# Patient Record
Sex: Female | Born: 1985 | Race: Black or African American | Hispanic: No | Marital: Single | Smoking: Never smoker
Health system: Southern US, Community
[De-identification: ages and names within clinical notes are randomized; demographics above are authoritative.]

## PROBLEM LIST (undated history)

## (undated) ENCOUNTER — Inpatient Hospital Stay (HOSPITAL_COMMUNITY): Payer: Self-pay

## (undated) DIAGNOSIS — Z8619 Personal history of other infectious and parasitic diseases: Secondary | ICD-10-CM

## (undated) DIAGNOSIS — D649 Anemia, unspecified: Secondary | ICD-10-CM

## (undated) HISTORY — DX: Anemia, unspecified: D64.9

## (undated) HISTORY — DX: Personal history of other infectious and parasitic diseases: Z86.19

---

## 2013-01-24 LAB — OB RESULTS CONSOLE GC/CHLAMYDIA
Chlamydia: NEGATIVE
Gonorrhea: NEGATIVE

## 2013-01-24 LAB — OB RESULTS CONSOLE HIV ANTIBODY (ROUTINE TESTING): HIV: NONREACTIVE

## 2013-01-24 LAB — OB RESULTS CONSOLE ABO/RH: RH TYPE: POSITIVE

## 2013-01-24 LAB — OB RESULTS CONSOLE RUBELLA ANTIBODY, IGM: Rubella: IMMUNE

## 2013-01-24 LAB — OB RESULTS CONSOLE RPR: RPR: NONREACTIVE

## 2013-01-24 LAB — OB RESULTS CONSOLE ANTIBODY SCREEN: Antibody Screen: NEGATIVE

## 2013-01-24 LAB — OB RESULTS CONSOLE HEPATITIS B SURFACE ANTIGEN: HEP B S AG: NEGATIVE

## 2013-02-06 NOTE — L&D Delivery Note (Addendum)
Called by RN @ approx 2:00 AM to evaluate pt due to urge to push. I entered the room to find pt calm in tub stating she could feel the "baby coming." On eval, head noted to be on perineum and BOWI. Cat 1 tracing obtained prior to pt getting in tub.  Delivery Note At 2:28 AM a viable female "Djet" was delivered encaul via Vaginal, Spontaneous Delivery (Presentation: Left Occiput Anterior) by Linden DolinWaterbirth.  APGAR: 9, 9; weight 8 lb 4.4 oz (3754 g).   Placenta status: Intact, Spontaneous.  Cord: 3 vessels with the following complications: None.  Cord pH: NA  Anesthesia: Local for repair Episiotomy: None Lacerations: 2nd degree vaginal and bilateral periurethrals Suture Repair: 3.0 vicryl vicryl rapide Est. Blood Loss (mL): 400 Pitocin IM given after delivery of placenta  Mom to postpartum.  Baby to Couplet care / Skin to Skin.  Parents decline circumcision  Mom plans to breastfeed   Sherre ScarletWILLIAMS, Paizleigh Wilds 09/13/2013, 4:39 AM

## 2013-04-09 ENCOUNTER — Encounter: Payer: Self-pay | Admitting: Obstetrics & Gynecology

## 2013-04-10 ENCOUNTER — Encounter: Payer: Self-pay | Admitting: Obstetrics & Gynecology

## 2013-04-20 ENCOUNTER — Inpatient Hospital Stay (HOSPITAL_COMMUNITY): Admission: AD | Admit: 2013-04-20 | Payer: Self-pay | Source: Ambulatory Visit | Admitting: Obstetrics & Gynecology

## 2013-08-21 ENCOUNTER — Telehealth (HOSPITAL_COMMUNITY): Payer: Self-pay | Admitting: *Deleted

## 2013-08-21 ENCOUNTER — Encounter (HOSPITAL_COMMUNITY): Payer: Self-pay | Admitting: *Deleted

## 2013-08-21 NOTE — Telephone Encounter (Signed)
Preadmission screen  

## 2013-08-22 ENCOUNTER — Observation Stay (HOSPITAL_COMMUNITY)
Admission: RE | Admit: 2013-08-22 | Discharge: 2013-08-22 | Disposition: A | Discharge status: Home / Self Care | Payer: Managed Care, Other (non HMO) | Source: Ambulatory Visit | Attending: Obstetrics and Gynecology | Admitting: Obstetrics and Gynecology

## 2013-08-22 VITALS — BP 115/60 | HR 72 | Temp 98.0°F | Resp 16

## 2013-08-22 DIAGNOSIS — O321XX0 Maternal care for breech presentation, not applicable or unspecified: Secondary | ICD-10-CM | POA: Diagnosis present

## 2013-08-22 DIAGNOSIS — O321XX1 Maternal care for breech presentation, fetus 1: Secondary | ICD-10-CM

## 2013-08-22 DIAGNOSIS — Z538 Procedure and treatment not carried out for other reasons: Secondary | ICD-10-CM | POA: Diagnosis not present

## 2013-08-22 DIAGNOSIS — O322XX0 Maternal care for transverse and oblique lie, not applicable or unspecified: Secondary | ICD-10-CM | POA: Diagnosis present

## 2013-08-22 LAB — RPR

## 2013-08-22 LAB — CBC
HCT: 35.4 % — ABNORMAL LOW (ref 36.0–46.0)
Hemoglobin: 11.6 g/dL — ABNORMAL LOW (ref 12.0–15.0)
MCH: 27.9 pg (ref 26.0–34.0)
MCHC: 32.8 g/dL (ref 30.0–36.0)
MCV: 85.1 fL (ref 78.0–100.0)
PLATELETS: 207 10*3/uL (ref 150–400)
RBC: 4.16 MIL/uL (ref 3.87–5.11)
RDW: 14.6 % (ref 11.5–15.5)
WBC: 7.3 10*3/uL (ref 4.0–10.5)

## 2013-08-22 LAB — TYPE AND SCREEN
ABO/RH(D): O POS
ANTIBODY SCREEN: NEGATIVE

## 2013-08-22 LAB — ABO/RH: ABO/RH(D): O POS

## 2013-08-22 MED ORDER — TERBUTALINE SULFATE 1 MG/ML IJ SOLN: 0.2500 mg | Freq: Once | INTRAMUSCULAR | Status: DC

## 2013-08-22 MED ORDER — ACETAMINOPHEN 325 MG PO TABS: 650.0000 mg | ORAL_TABLET | ORAL | Status: DC | PRN

## 2013-08-22 MED ORDER — ONDANSETRON HCL 4 MG/2ML IJ SOLN: 4.0000 mg | Freq: Four times a day (QID) | INTRAMUSCULAR | Status: DC | PRN

## 2013-08-22 MED ORDER — IBUPROFEN 600 MG PO TABS: 600.0000 mg | ORAL_TABLET | Freq: Four times a day (QID) | ORAL | Status: DC | PRN

## 2013-08-22 MED ORDER — LACTATED RINGERS IV SOLN: 500.0000 mL | INTRAVENOUS | Status: DC | PRN

## 2013-08-22 MED ORDER — LACTATED RINGERS IV SOLN
INTRAVENOUS | Status: DC
  Administered 2013-08-22: 08:00:00 via INTRAVENOUS

## 2013-08-22 MED ORDER — OXYCODONE-ACETAMINOPHEN 5-325 MG PO TABS: 1.0000 | ORAL_TABLET | ORAL | Status: DC | PRN

## 2013-08-22 NOTE — Progress Notes (Signed)
External Cephalic Version Note  Indication: SIUP at 37+6  weeks with  Transverse presentation   Procedure has been reviewed with patient with R&B including need to proceed emergently with cesarean delivery fetal intolerance to the procedure. Patient is agreeable to proceed and has signed the consent form.  Patient is NPO with a saline lock.  NST pre-procedure: Category 1 with baseline at 140 bpm  Bedside ultrasound: cephalic position with normal AFI  Procedure:  Cancelled   NST post-procedure: N/A  Patient was discharged in good condition [facility] as scheduled   Silverio LaySandra Mattilyn Crites MD

## 2013-08-22 NOTE — Progress Notes (Signed)
Reviewed D/C instructions with pt and FOB.  Answered questions for family.  Copy of instructions given to pt and FOB.  Keep scheduled appt with office next week.

## 2013-08-22 NOTE — Discharge Summary (Signed)
[facility] external version.  After US For position it was determined that the fetus was in the vertex position and per Dr Estanislado Pandyivard the external version was canceled and further fetal monitor was not necessary.  Pt DC'd to home with labor precaution and kick count instructions.  Pt encourage to keep her next ROB appointment.

## 2013-09-12 ENCOUNTER — Inpatient Hospital Stay (HOSPITAL_COMMUNITY)
Admission: AD | Admit: 2013-09-12 | Discharge: 2013-09-12 | Disposition: A | Discharge status: Home / Self Care | Payer: Managed Care, Other (non HMO) | Source: Ambulatory Visit | Attending: Obstetrics & Gynecology | Admitting: Obstetrics & Gynecology

## 2013-09-12 ENCOUNTER — Encounter (HOSPITAL_COMMUNITY): Payer: Self-pay

## 2013-09-12 ENCOUNTER — Inpatient Hospital Stay (HOSPITAL_COMMUNITY)
Admission: AD | Admit: 2013-09-12 | Discharge: 2013-09-15 | DRG: 775 | Disposition: A | Discharge status: Home / Self Care | Payer: Managed Care, Other (non HMO) | Source: Ambulatory Visit | Attending: Obstetrics and Gynecology | Admitting: Obstetrics and Gynecology

## 2013-09-12 DIAGNOSIS — F1210 Cannabis abuse, uncomplicated: Secondary | ICD-10-CM | POA: Diagnosis present

## 2013-09-12 DIAGNOSIS — Z91040 Latex allergy status: Secondary | ICD-10-CM

## 2013-09-12 DIAGNOSIS — O99344 Other mental disorders complicating childbirth: Secondary | ICD-10-CM | POA: Diagnosis present

## 2013-09-12 DIAGNOSIS — Z8249 Family history of ischemic heart disease and other diseases of the circulatory system: Secondary | ICD-10-CM

## 2013-09-12 DIAGNOSIS — D509 Iron deficiency anemia, unspecified: Secondary | ICD-10-CM | POA: Diagnosis present

## 2013-09-12 DIAGNOSIS — O479 False labor, unspecified: Secondary | ICD-10-CM | POA: Insufficient documentation

## 2013-09-12 DIAGNOSIS — O9902 Anemia complicating childbirth: Secondary | ICD-10-CM | POA: Diagnosis present

## 2013-09-12 MED ORDER — OXYCODONE-ACETAMINOPHEN 5-325 MG PO TABS: 1.0000 | ORAL_TABLET | ORAL | Status: DC | PRN

## 2013-09-12 MED ORDER — FLEET ENEMA 7-19 GM/118ML RE ENEM: 1.0000 | ENEMA | RECTAL | Status: DC | PRN

## 2013-09-12 MED ORDER — OXYTOCIN BOLUS FROM INFUSION: 500.0000 mL | INTRAVENOUS | Status: DC

## 2013-09-12 MED ORDER — LACTATED RINGERS IV SOLN: 500.0000 mL | INTRAVENOUS | Status: DC | PRN

## 2013-09-12 MED ORDER — ACETAMINOPHEN 325 MG PO TABS: 650.0000 mg | ORAL_TABLET | ORAL | Status: DC | PRN

## 2013-09-12 MED ORDER — IBUPROFEN 600 MG PO TABS: 600.0000 mg | ORAL_TABLET | Freq: Four times a day (QID) | ORAL | Status: DC | PRN

## 2013-09-12 MED ORDER — CITRIC ACID-SODIUM CITRATE 334-500 MG/5ML PO SOLN: 30.0000 mL | ORAL | Status: DC | PRN

## 2013-09-12 MED ORDER — LIDOCAINE HCL (PF) 1 % IJ SOLN
30.0000 mL | INTRAMUSCULAR | Status: DC | PRN
  Administered 2013-09-13: 30 mL via SUBCUTANEOUS
  Filled 2013-09-12: qty 30

## 2013-09-12 MED ORDER — OXYTOCIN 40 UNITS IN LACTATED RINGERS INFUSION - SIMPLE MED: 62.5000 mL/h | INTRAVENOUS | Status: DC

## 2013-09-12 NOTE — H&P (Signed)
[name] is a 28 y.o. female, G1P0 at 41.0 weeks, presenting for possible LOF and ctxs all day (q 7-10 min), that have become progressively worse, now q 5 min since arrival to hospital. Reports bloody show and active fetus. Attended WB class and planning WB. Last evaluation at [redacted]w[redacted]d - NST reactive.  Patient Active Problem List   Diagnosis Date Noted  . Normal labor 09/12/2013  . Latex allergy - Pruritis 09/12/2013  . Anemia, iron deficiency 09/12/2013  Transfer in from [facility] Refused 1hr glucola after appropriate counseling Refused GBS screen after apropriate counseling  History of present pregnancy: Patient entered care at 27.6 weeks, transfer in from [facility].   EDC of 09/05/13 was established by LMP.   Anatomy scan: 19.4 weeks, with normal findings and a posterior placenta.   Additional [place] evaluations:  [redacted]w[redacted]d due to transfer of care - SIUP, vtx presentation, posterior placenta; no previa, normal fluid, normal placenta cord insertion; AFI 15.5 cm (55th%tile); no late presenting fetal abnormality seen; cervix closed. [redacted]w[redacted]d for presentation - SIUP, transverse presentation, AFI normal (50th percentile). [redacted]w[redacted]d for presentation/late term: Vtx presentation, AFI normal (65th percentile), BPP 8/8 in 3 min.    Significant prenatal events: Was scheduled for ECV on 08/22/13 @ 37.6 wks 2/2 transverse presentation, but noted to be vertex on day of procedure. Struggled with headaches in pregnancy - no significant findings.    Last evaluation:  @ [redacted]w[redacted]d; cervix FT/thick/-3 .  OB History   Grav Para Term Preterm Abortions TAB SAB Ect Mult Living   1              Past Medical History  Diagnosis Date  . Anemia   . Hx of chlamydia infection   . Hx of gonorrhea    History reviewed. No pertinent past surgical history. Family History: family history includes Heart disease in her maternal grandmother; Miscarriages / [place] in her maternal aunt. FOB with IDDM; FOB's sister w/  Grave's disease. Social History:  reports that she has never smoked. She does not have any smokeless tobacco history on file. She reports that she uses illicit drugs (Marijuana). Her alcohol history is not on file. FOB Sheran Lawless Agbaza present and supportive. Pt is African-American with a 4-yr college degree; pt is a vegetarian.   Prenatal Transfer Tool  Maternal Diabetes: Unknown; refused 1hr; CBG log never brought to prenatal visits for review Genetic Screening: Declined Maternal Ultrasounds/Referrals: Normal Fetal Ultrasounds or other Referrals:  None Maternal Substance Abuse:  Yes:  Type: Marijuana x 1 since LMP Significant Maternal Medications:  Meds include: Other: Iron daily, PNV, Primose oil Significant Maternal Lab Results: Lab values include: Other: GBS unknown. Pt. Refused screening before and after thorough counseling    ROS:  Ctxs, VB, LOF, +FM  Allergies  Allergen Reactions  . Latex Itching     Dilation: 4 Effacement (%): 50 Station: -2 Exam by:: Dia Sitter RN  Blood pressure 126/75, pulse 92, temperature 98.4 F (36.9 C), temperature source Oral, resp. rate 16, height 5\' 6"  (1.676 m), weight 203 lb (92.08 kg), last menstrual period 11/30/2012, SpO2 91.00%.  Gen: NAD, breathing through ctxs Chest clear Heart RRR without murmur Abd gravid, palpate soft between ctxs, NT, FH 39 cm on 09/09/13 Pelvic: 4/50/-2 Cephalic by Leopolds, confirmed by bedside sono Ext: WNL  FHR: Cat 1, BPP 8/8 on 09/09/13  UCs: Irregular  Prenatal labs: ABO, Rh: --/--/O POS, O POS (07/17 0740) Antibody: NEG (07/17 0740) Rubella:   Immune RPR:  NON REAC (07/17 0740)  HBsAg: Negative (12/19 0000)  HIV: Non-reactive (12/19 0000)  GBS: Unknown Sickle cell/Hgb electrophoresis: Normal study Pap: Normal in Feb 2014 GC: Neg Chlamydia:  Neg Genetic screenings:  Declined Glucola:  Refused. Was to monitor CBGs qid x 2 wks, but never brought log to office visits; states values were all  normal. Other: Hgb 9.6 in 01/2013; indices done; transferrin elevated. Took Iron throughout pregnancy. Urine culture negative.  Assessment: IUP at 41.0 wks Late term pregnancy Latent phase labor GBS unknown   Plan: Admitted to BS Routine [facility] orders Birth plan reviewed Intermittent monitoring; planning waterbirth Consult prn Expect progress and SVD  Robyne AskewWILLIAMS, KIMBERLYCNM, MS 09/12/13 @ 11:09 PM

## 2013-09-12 NOTE — MAU Provider Note (Signed)
History    Patient is a 27y.o. G1P0 at 40.6wks who presents for contractions.  Patient states she has been contracting all day and recently contractions have become more frequent.  Patient also reporting active fetus and bloody show, but denies LoF.  Patient desires WB and GBS status is unknown due to patient refusal.     Patient Active Problem List   Diagnosis Date Noted  . Breech presentation 08/22/2013    Chief Complaint  Patient presents with  . Labor Eval   HPI  OB History   Grav Para Term Preterm Abortions TAB SAB Ect Mult Living   1               Past Medical History  Diagnosis Date  . Anemia   . Hx of chlamydia infection   . Hx of gonorrhea     History reviewed. No pertinent past surgical history.  Family History  Problem Relation Age of Onset  . Miscarriages / Stillbirths Maternal Aunt   . Heart disease Maternal Grandmother     History  Substance Use Topics  . Smoking status: Never Smoker   . Smokeless tobacco: Not on file  . Alcohol Use: Not on file    Allergies:  Allergies  Allergen Reactions  . Latex Itching    Prescriptions prior to admission  Medication Sig Dispense Refill  . Prenatal Vit-Fe Fumarate-FA (MULTIVITAMIN-PRENATAL) 27-0.8 MG TABS tablet Take 1 tablet by mouth daily at 12 noon.        ROS  See HPI Above Physical Exam   Blood pressure 130/76, pulse 73, temperature 98.3 F (36.8 C), temperature source Oral, resp. rate 18, height 5\' 6"  (1.676 m), weight 207 lb (93.895 kg), last menstrual period 11/30/2012.  Physical Exam  Constitutional: She is oriented to person, place, and time. She appears well-developed and well-nourished. No distress.  Bright affect, smiling and talking through contractions.  Cardiovascular: Normal rate.   Respiratory: Effort normal.  GI: Soft.  Appears gravid--fundal height appropriate for GA Contractions palpate mild to moderate, fundus soft and NT during rest.   Genitourinary: Vagina normal.  -SVE:  0.5/50/-3, Soft, Posterior, Bloody Show  Neurological: She is alert and oriented to person, place, and time.  Skin: Skin is warm and dry.   FHR: 125 bpm, Mod Var, -Decels, +Accels UC: Q5-737min, graphs strong, palpates moderate ED Course  Assessment: IUP at 40.6wks Early Labor Cat I FT  Plan: -Patient given option to be observed for 1-2hrs for cervical change or return home to labor at home. -Patient opt to return home and [place] -Discussed providers discomfort with patient being located >3530min from hospital.  Patient verbalized understanding, but desires comfort of home. -Precautions discussed including Bleeding, SROM, Fetal Movement, Transport while Contracting, and [facility]als [facility] -Patient instructed to call every 2hrs to update provider and report to hospital immediately as discussed in precautions--Patient and husband verbalized understanding -NST reactive -Patient discharged to home  [facility] HospitalEMLY, Odilia Damico LYNN CNM, MSN 09/12/2013 3:39 AM

## 2013-09-12 NOTE — MAU Note (Signed)
Water broke about 1915.  Some bloody streaks in fld but otherwise clear. Was here this am for labor ck. And closed. Desires water birth

## 2013-09-12 NOTE — MAU Note (Signed)
Pt c/o contractions every 5-10 mins. Denies LOF or vag bleeding. +FM

## 2013-09-12 NOTE — Progress Notes (Signed)
Patient declined CBC

## 2013-09-12 NOTE — Progress Notes (Signed)
[facility] to [facility] via w/c for further eval of SROM

## 2013-09-12 NOTE — Discharge Instructions (Signed)

## 2013-09-13 ENCOUNTER — Encounter (HOSPITAL_COMMUNITY): Payer: Self-pay

## 2013-09-13 LAB — CBC
HCT: 33.1 % — ABNORMAL LOW (ref 36.0–46.0)
Hemoglobin: 11.2 g/dL — ABNORMAL LOW (ref 12.0–15.0)
MCH: 28.1 pg (ref 26.0–34.0)
MCHC: 33.8 g/dL (ref 30.0–36.0)
MCV: 83.2 fL (ref 78.0–100.0)
Platelets: 207 10*3/uL (ref 150–400)
RBC: 3.98 MIL/uL (ref 3.87–5.11)
RDW: 14.7 % (ref 11.5–15.5)
WBC: 22 10*3/uL — ABNORMAL HIGH (ref 4.0–10.5)

## 2013-09-13 MED ORDER — SENNOSIDES-DOCUSATE SODIUM 8.6-50 MG PO TABS: 2.0000 | ORAL_TABLET | ORAL | Status: DC

## 2013-09-13 MED ORDER — BENZOCAINE-MENTHOL 20-0.5 % EX AERO: 1.0000 "application " | INHALATION_SPRAY | CUTANEOUS | Status: DC | PRN

## 2013-09-13 MED ORDER — IBUPROFEN 600 MG PO TABS
600.0000 mg | ORAL_TABLET | Freq: Four times a day (QID) | ORAL | Status: DC
  Filled 2013-09-13: qty 1

## 2013-09-13 MED ORDER — DIPHENHYDRAMINE HCL 25 MG PO CAPS: 25.0000 mg | ORAL_CAPSULE | Freq: Four times a day (QID) | ORAL | Status: DC | PRN

## 2013-09-13 MED ORDER — ONDANSETRON HCL 4 MG PO TABS: 4.0000 mg | ORAL_TABLET | ORAL | Status: DC | PRN

## 2013-09-13 MED ORDER — DIBUCAINE 1 % RE OINT: 1.0000 "application " | TOPICAL_OINTMENT | RECTAL | Status: DC | PRN

## 2013-09-13 MED ORDER — OXYTOCIN 10 UNIT/ML IJ SOLN
INTRAMUSCULAR | Status: AC
  Administered 2013-09-13: 10 [IU]
  Filled 2013-09-13: qty 1

## 2013-09-13 MED ORDER — WITCH HAZEL-GLYCERIN EX PADS: 1.0000 "application " | MEDICATED_PAD | CUTANEOUS | Status: DC | PRN

## 2013-09-13 MED ORDER — ONDANSETRON HCL 4 MG/2ML IJ SOLN: 4.0000 mg | INTRAMUSCULAR | Status: DC | PRN

## 2013-09-13 MED ORDER — SIMETHICONE 80 MG PO CHEW: 80.0000 mg | CHEWABLE_TABLET | ORAL | Status: DC | PRN

## 2013-09-13 MED ORDER — MEDROXYPROGESTERONE ACETATE 150 MG/ML IM SUSP: 150.0000 mg | INTRAMUSCULAR | Status: DC | PRN

## 2013-09-13 MED ORDER — PRENATAL MULTIVITAMIN CH: 1.0000 | ORAL_TABLET | Freq: Every day | ORAL | Status: DC

## 2013-09-13 MED ORDER — OXYCODONE-ACETAMINOPHEN 5-325 MG PO TABS: 1.0000 | ORAL_TABLET | ORAL | Status: DC | PRN

## 2013-09-13 MED ORDER — ZOLPIDEM TARTRATE 5 MG PO TABS: 5.0000 mg | ORAL_TABLET | Freq: Every evening | ORAL | Status: DC | PRN

## 2013-09-13 MED ORDER — LANOLIN HYDROUS EX OINT: TOPICAL_OINTMENT | CUTANEOUS | Status: DC | PRN

## 2013-09-13 MED ORDER — TETANUS-DIPHTH-ACELL PERTUSSIS 5-2.5-18.5 LF-MCG/0.5 IM SUSP: 0.5000 mL | Freq: Once | INTRAMUSCULAR | Status: DC

## 2013-09-13 NOTE — Progress Notes (Signed)
Clinical Social Work Department PSYCHOSOCIAL ASSESSMENT - MATERNAL/CHILD 09/13/2013  Patient:  [name]  Account Number:  [id number]  Admit Date:  09/12/2013  Childs Name:   [name]    Clinical Social Worker:  Keli Buehner, LCSW   Date/Time:  09/13/2013 01:12 AM  Date Referred:  09/13/2013   Referral source  Central Nursery     Referred reason  Substance Abuse   Other referral source:    I:  FAMILY / HOME ENVIRONMENT Child's legal guardian:  PARENT  Guardian - Name Guardian - Age Guardian - Address  Jackie [name] 27 [address]  [place], [place] [address]  [name], [name]  same as above   Other household support members/support persons Other support:   Family    II  PSYCHOSOCIAL DATA Information Source:    Financial and Community Resources Employment:   Financial resources:  Private Insurance If Medicaid - County:   Other  WIC   School / Grade:   Maternity Care Coordinator / Child Services Coordination / Early Interventions:  Cultural issues impacting care:    III  STRENGTHS Strengths  Supportive family/friends  Home prepared for Child (including basic supplies)  Adequate Resources   Strength comment:    IV  RISK FACTORS AND CURRENT PROBLEMS Current Problem:       V  SOCIAL WORK ASSESSMENT Acknowledged order for Social Work consult to assess mother's history of marijuana.  FOB was presents and mother gave premission to speak with her in his presence.   Parents are not married, but cohabitate.    They have no other dependents.  FOB is employed and very supportive of mother and newborn.  He was very attentive to newborn during CSW visit.    Mother acknowledged hx of marijuana, but denies any during the pregnancy.  She admits to trying marijuana in the past and seemed embarrassed by this when FOB questioned why a drug screen was being done on the baby.  She denies any hx of depression or anxiety.  No acute social concerns noted at this  time.  Mother informed of social work availability.      VI SOCIAL WORK PLAN Social Work Plan  No Barriers to Discharge   Type of pt/family education:  Hospital drug screen policy If child protective services report - county:   If child protective services report - date:   Information/referral to community resources comment:   Other social work plan:   Will continue to monitor drug screen     

## 2013-09-13 NOTE — Lactation Note (Signed)
This note was copied from the chart of Boy Raechel ChuteKrystle Knee. Lactation Consultation Note  Patient Name: Boy Raechel ChuteKrystle Read NFAOZ'HToday's Date: 09/13/2013 Reason for consult: Follow-up assessment at mom's request but [facility], baby is already well-latched and mom says he latched to (R) breast about 5 minutes ago and is sucking rhythmically with intermittent swallows and no nipple discomfort.  Mom has him STS during feeding and FOB is at bedside to assist as needed.  LC reviewed signs of proper latch and milk transfer and encouraged mom to cue feed.   Maternal Data    Feeding Feeding Type: Breast Fed  LATCH Score/Interventions Latch: Grasps breast easily, tongue down, lips flanged, rhythmical sucking.  Audible Swallowing: Spontaneous and intermittent  Type of Nipple:  (LC unable to assess; baby already latched)  Comfort (Breast/Nipple): Soft / non-tender     Hold (Positioning): No assistance needed to correctly position infant at breast.   LATCH score>8 based on parameters that LC able to observe and per mom's report)  Lactation Tools Discussed/Used   Signs of proper latch Cue feedings  Consult Status Consult Status: Follow-up Date: 09/14/13 Follow-up type: In-patient    Warrick ParisianBryant, Reizy Dunlow [facility] 09/13/2013, 10:25 PM

## 2013-09-13 NOTE — Progress Notes (Signed)
Subjective: Postpartum Day 0: Vaginal delivery, 2nd degree vaginal and bilateral periurethral lacerations Patient up ad lib, reports no syncope or dizziness. Feeding:  Working on breastfeeding Contraceptive plan:  Undecided  Objective: Vital signs in last 24 hours: Temp:  [97.9 F (36.6 C)-98.9 F (37.2 C)] 98.9 F (37.2 C) (08/08 1028) Pulse Rate:  [74-96] 84 (08/08 1028) Resp:  [16-20] 18 (08/08 1028) BP: (118-136)/(58-76) 119/64 mmHg (08/08 1028) SpO2:  [91 %-100 %] 100 % (08/08 0625) Weight:  [203 lb (92.08 kg)-203 lb 3.2 oz (92.171 kg)] 203 lb (92.08 kg) (08/07 2045)  Physical Exam:  General: alert Lochia: appropriate Uterine Fundus: firm Perineum: healing well DVT Evaluation: No evidence of DVT seen on physical exam. Negative Homan's sign.    Recent Labs  09/13/13 0658  HGB 11.2*  HCT 33.1*    Assessment/Plan: Status post vaginal delivery day 0. Unknown GBS--patient declined testing. Stable Continue current care.   Mariaelena Cade, VICKICNM 09/13/2013, 11:46 AM

## 2013-09-14 MED ORDER — IBUPROFEN 600 MG PO TABS: 600.0000 mg | ORAL_TABLET | Freq: Four times a day (QID) | ORAL | Status: AC

## 2013-09-14 MED ORDER — OXYCODONE-ACETAMINOPHEN 5-325 MG PO TABS: 1.0000 | ORAL_TABLET | ORAL | Status: DC | PRN

## 2013-09-14 NOTE — Discharge Instructions (Signed)
Vaginal Delivery, Care After °Refer to this sheet in the next few weeks. These discharge instructions provide you with information on caring for yourself after delivery. Your caregiver may also give you specific instructions. Your treatment has been planned according to the most current medical practices available, but problems sometimes occur. Call your caregiver if you have any problems or questions after you go home. °HOME CARE INSTRUCTIONS °· Take over-the-counter or prescription medicines only as directed by your caregiver or pharmacist. °· Do not drink alcohol, especially if you are breastfeeding or taking medicine to relieve pain. °· Do not chew or smoke tobacco. °· Do not use illegal drugs. °· Continue to use good perineal care. Good perineal care includes: °¨ Wiping your perineum from front to back. °¨ Keeping your perineum clean. °· Do not use tampons or douche until your caregiver says it is okay. °· Shower, wash your hair, and take tub baths as directed by your caregiver. °· Wear a well-fitting bra that provides breast support. °· Eat healthy foods. °· Drink enough fluids to keep your urine clear or pale yellow. °· Eat high-fiber foods such as whole grain cereals and breads, brown rice, beans, and fresh fruits and vegetables every day. These foods may help prevent or relieve constipation. °· Follow your caregiver's recommendations regarding resumption of activities such as climbing stairs, driving, lifting, exercising, or traveling. °· Talk to your caregiver about resuming sexual activities. Resumption of sexual activities is dependent upon your risk of infection, your rate of healing, and your comfort and desire to resume sexual activity. °· Try to have someone help you with your household activities and your newborn for at least a few days after you leave the hospital. °· Rest as much as possible. Try to rest or take a nap when your newborn is sleeping. °· Increase your activities gradually. °· Keep  all of your scheduled postpartum appointments. It is very important to keep your scheduled follow-up appointments. At these appointments, your caregiver will be checking to make sure that you are healing physically and emotionally. °SEEK MEDICAL CARE IF:  °· You are passing large clots from your vagina. Save any clots to show your caregiver. °· You have a foul smelling discharge from your vagina. °· You have trouble urinating. °· You are urinating frequently. °· You have pain when you urinate. °· You have a change in your bowel movements. °· You have increasing redness, pain, or swelling near your vaginal incision (episiotomy) or vaginal tear. °· You have pus draining from your episiotomy or vaginal tear. °· Your episiotomy or vaginal tear is separating. °· You have painful, hard, or reddened breasts. °· You have a severe headache. °· You have blurred vision or see spots. °· You feel sad or depressed. °· You have thoughts of hurting yourself or your newborn. °· You have questions about your care, the care of your newborn, or medicines. °· You are dizzy or light-headed. °· You have a rash. °· You have nausea or vomiting. °· You were breastfeeding and have not had a menstrual period within 12 weeks after you stopped breastfeeding. °· You are not breastfeeding and have not had a menstrual period by the 12th week after delivery. °· You have a fever. °SEEK IMMEDIATE MEDICAL CARE IF:  °· You have persistent pain. °· You have chest pain. °· You have shortness of breath. °· You faint. °· You have leg pain. °· You have stomach pain. °· Your vaginal bleeding saturates two or more sanitary pads   in 1 hour. MAKE SURE YOU:   Understand these instructions.  Will watch your condition.  Will get help right away if you are not doing well or get worse. Document Released: 01/21/2000 Document Revised: 06/09/2013 Document Reviewed: 09/20/2011 [facility] Patient Information 2015 [place], [place]. This information is not intended to  replace advice given to you by your health care provider. Make sure you discuss any questions you have with your health care provider. Breastfeeding Deciding to breastfeed is one of the best choices you can make for you and your baby. A change in hormones during pregnancy causes your breast tissue to grow and increases the number and size of your milk ducts. These hormones also allow proteins, sugars, and fats from your blood supply to make breast milk in your milk-producing glands. Hormones prevent breast milk from being released before your baby is born as well as prompt milk flow after birth. Once breastfeeding has begun, thoughts of your baby, as well as his or her sucking or crying, can stimulate the release of milk from your milk-producing glands.  BENEFITS OF BREASTFEEDING For Your Baby  Your first milk (colostrum) helps your baby's digestive system function better.   There are antibodies in your milk that help your baby fight off infections.   Your baby has a lower incidence of asthma, allergies, and sudden infant death syndrome.   The nutrients in breast milk are better for your baby than infant formulas and are designed uniquely for your baby's needs.   Breast milk improves your baby's brain development.   Your baby is less likely to develop other conditions, such as childhood obesity, asthma, or type 2 diabetes mellitus.  For You   Breastfeeding helps to create a very special bond between you and your baby.   Breastfeeding is convenient. Breast milk is always available at the correct temperature and costs nothing.   Breastfeeding helps to burn calories and helps you lose the weight gained during pregnancy.   Breastfeeding makes your uterus contract to its prepregnancy size faster and slows bleeding (lochia) after you give birth.   Breastfeeding helps to lower your risk of developing type 2 diabetes mellitus, osteoporosis, and breast or ovarian cancer later in  life. SIGNS THAT YOUR BABY IS HUNGRY Early Signs of Hunger  Increased alertness or activity.  Stretching.  Movement of the head from side to side.  Movement of the head and opening of the mouth when the corner of the mouth or cheek is stroked (rooting).  Increased sucking sounds, smacking lips, cooing, sighing, or squeaking.  Hand-to-mouth movements.  Increased sucking of fingers or hands. Late Signs of Hunger  Fussing.  Intermittent crying. Extreme Signs of Hunger Signs of extreme hunger will require calming and consoling before your baby will be able to breastfeed successfully. Do not wait for the following signs of extreme hunger to occur before you initiate breastfeeding:   Restlessness.  A loud, strong cry.   Screaming. BREASTFEEDING BASICS Breastfeeding Initiation  Find a comfortable place to sit or lie down, with your neck and back well supported.  Place a pillow or rolled up blanket under your baby to bring him or her to the level of your breast (if you are seated). Nursing pillows are specially designed to help support your arms and your baby while you breastfeed.  Make sure that your baby's abdomen is facing your abdomen.   Gently massage your breast. With your fingertips, massage from your chest wall toward your nipple in a circular  motion. This encourages milk flow. You may need to continue this action during the feeding if your milk flows slowly.  Support your breast with 4 fingers underneath and your thumb above your nipple. Make sure your fingers are well away from your nipple and your baby's mouth.   Stroke your baby's lips gently with your finger or nipple.   When your baby's mouth is open wide enough, quickly bring your baby to your breast, placing your entire nipple and as much of the colored area around your nipple (areola) as possible into your baby's mouth.   More areola should be visible above your baby's upper lip than below the lower lip.    Your baby's tongue should be between his or her lower gum and your breast.   Ensure that your baby's mouth is correctly positioned around your nipple (latched). Your baby's lips should create a seal on your breast and be turned out (everted).  It is common for your baby to suck about 2-3 minutes in order to start the flow of breast milk. Latching Teaching your baby how to latch on to your breast properly is very important. An improper latch can cause nipple pain and decreased milk supply for you and poor weight gain in your baby. Also, if your baby is not latched onto your nipple properly, he or she may swallow some air during feeding. This can make your baby fussy. Burping your baby when you switch breasts during the feeding can help to get rid of the air. However, teaching your baby to latch on properly is still the best way to prevent fussiness from swallowing air while breastfeeding. Signs that your baby has successfully latched on to your nipple:    Silent tugging or silent sucking, without causing you pain.   Swallowing heard between every 3-4 sucks.    Muscle movement above and in front of his or her ears while sucking.  Signs that your baby has not successfully latched on to nipple:   Sucking sounds or smacking sounds from your baby while breastfeeding.  Nipple pain. If you think your baby has not latched on correctly, slip your finger into the corner of your baby's mouth to break the suction and place it between your baby's gums. Attempt breastfeeding initiation again. Signs of Successful Breastfeeding Signs from your baby:   A gradual decrease in the number of sucks or complete cessation of sucking.   Falling asleep.   Relaxation of his or her body.   Retention of a small amount of milk in his or her mouth.   Letting go of your breast by himself or herself. Signs from you:  Breasts that have increased in firmness, weight, and size 1-3 hours after feeding.    Breasts that are softer immediately after breastfeeding.  Increased milk volume, as well as a change in milk consistency and color by the fifth day of breastfeeding.   Nipples that are not sore, cracked, or bleeding. Signs That Your Pecola LeisureBaby is Getting Enough Milk  Wetting at least 3 diapers in a 24-hour period. The urine should be clear and pale yellow by age 30 days.  At least 3 stools in a 24-hour period by age 30 days. The stool should be soft and yellow.  At least 3 stools in a 24-hour period by age 7 days. The stool should be seedy and yellow.  No loss of weight greater than 10% of birth weight during the first 463 days of age.  Average weight gain  of 4-7 ounces (113-198 g) per week after age 22 days.  Consistent daily weight gain by age 27 days, without weight loss after the age of 2 weeks. After a feeding, your baby may spit up a small amount. This is common. BREASTFEEDING FREQUENCY AND DURATION Frequent feeding will help you make more milk and can prevent sore nipples and breast engorgement. Breastfeed when you feel the need to reduce the fullness of your breasts or when your baby shows signs of hunger. This is called "breastfeeding on demand." Avoid introducing a pacifier to your baby while you are working to establish breastfeeding (the first 4-6 weeks after your baby is born). After this time you may choose to use a pacifier. Research has shown that pacifier use during the first year of a baby's life decreases the risk of sudden infant death syndrome (SIDS). Allow your baby to feed on each breast as long as he or she wants. Breastfeed until your baby is finished feeding. When your baby unlatches or falls asleep while feeding from the first breast, offer the second breast. Because newborns are often sleepy in the first few weeks of life, you may need to awaken your baby to get him or her to feed. Breastfeeding times will vary from baby to baby. However, the following rules can serve as a  guide to help you ensure that your baby is properly fed:  Newborns (babies 494 weeks of age or younger) may breastfeed every 1-3 hours.  Newborns should not go longer than 3 hours during the day or 5 hours during the night without breastfeeding.  You should breastfeed your baby a minimum of 8 times in a 24-hour period until you begin to introduce solid foods to your baby at around 236 months of age. BREAST MILK PUMPING Pumping and storing breast milk allows you to ensure that your baby is exclusively fed your breast milk, even at times when you are unable to breastfeed. This is especially important if you are going back to work while you are still breastfeeding or when you are not able to be present during feedings. Your lactation consultant can give you guidelines on how long it is safe to store breast milk.  A breast pump is a machine that allows you to pump milk from your breast into a sterile bottle. The pumped breast milk can then be stored in a refrigerator or freezer. Some breast pumps are operated by hand, while others use electricity. Ask your lactation consultant which type will work best for you. Breast pumps can be purchased, but some hospitals and breastfeeding support groups lease breast pumps on a monthly basis. A lactation consultant can teach you how to hand express breast milk, if you prefer not to use a pump.  CARING FOR YOUR BREASTS WHILE YOU BREASTFEED Nipples can become dry, cracked, and sore while breastfeeding. The following recommendations can help keep your breasts moisturized and healthy:  Avoid using soap on your nipples.   Wear a supportive bra. Although not required, special nursing bras and tank tops are designed to allow access to your breasts for breastfeeding without taking off your entire bra or top. Avoid wearing underwire-style bras or extremely tight bras.  Air dry your nipples for 3-664minutes after each feeding.   Use only cotton bra pads to absorb leaked  breast milk. Leaking of breast milk between feedings is normal.   Use lanolin on your nipples after breastfeeding. Lanolin helps to maintain your skin's normal moisture barrier. If you use  pure lanolin, you do not need to wash it off before feeding your baby again. Pure lanolin is not toxic to your baby. You may also hand express a few drops of breast milk and gently massage that milk into your nipples and allow the milk to air dry. In the first few weeks after giving birth, some women experience extremely full breasts (engorgement). Engorgement can make your breasts feel heavy, warm, and tender to the touch. Engorgement peaks within 3-5 days after you give birth. The following recommendations can help ease engorgement:  Completely empty your breasts while breastfeeding or pumping. You may want to start by applying warm, moist heat (in the shower or with warm water-soaked hand towels) just before feeding or pumping. This increases circulation and helps the milk flow. If your baby does not completely empty your breasts while breastfeeding, pump any extra milk after he or she is finished.  Wear a snug bra (nursing or regular) or tank top for 1-2 days to signal your body to slightly decrease milk production.  Apply ice packs to your breasts, unless this is too uncomfortable for you.  Make sure that your baby is latched on and positioned properly while breastfeeding. If engorgement persists after 48 hours of following these recommendations, contact your health care provider or a Advertising copywriter. OVERALL HEALTH CARE RECOMMENDATIONS WHILE BREASTFEEDING  Eat healthy foods. Alternate between meals and snacks, eating 3 of each per day. Because what you eat affects your breast milk, some of the foods may make your baby more irritable than usual. Avoid eating these foods if you are sure that they are negatively affecting your baby.  Drink milk, fruit juice, and water to satisfy your thirst (about 10  glasses a day).   Rest often, relax, and continue to take your prenatal vitamins to prevent fatigue, stress, and anemia.  Continue breast self-awareness checks.  Avoid chewing and smoking tobacco.  Avoid alcohol and drug use. Some medicines that may be harmful to your baby can pass through breast milk. It is important to ask your health care provider before taking any medicine, including all over-the-counter and prescription medicine as well as vitamin and herbal supplements. It is possible to become pregnant while breastfeeding. If birth control is desired, ask your health care provider about options that will be safe for your baby. SEEK MEDICAL CARE IF:   You feel like you want to stop breastfeeding or have become frustrated with breastfeeding.  You have painful breasts or nipples.  Your nipples are cracked or bleeding.  Your breasts are red, tender, or warm.  You have a swollen area on either breast.  You have a fever or chills.  You have nausea or vomiting.  You have drainage other than breast milk from your nipples.  Your breasts do not become full before feedings by the fifth day after you give birth.  You feel sad and depressed.  Your baby is too sleepy to eat well.  Your baby is having trouble sleeping.   Your baby is wetting less than 3 diapers in a 24-hour period.  Your baby has less than 3 stools in a 24-hour period.  Your baby's skin or the white part of his or her eyes becomes yellow.   Your baby is not gaining weight by 28 days of age. SEEK IMMEDIATE MEDICAL CARE IF:   Your baby is overly tired (lethargic) and does not want to wake up and feed.  Your baby develops an unexplained fever. Document Released: 01/23/2005  Document Revised: 01/28/2013 Document Reviewed: 07/17/2012 [facility] Patient Information 2015 [place], [place]. This information is not intended to replace advice given to you by your health care provider. Make sure you discuss any questions  you have with your health care provider. Postpartum Depression and Baby Blues The postpartum period begins right after the birth of a baby. During this time, there is often a great amount of joy and excitement. It is also a time of many changes in the life of the parents. Regardless of how many times a mother gives birth, each child brings new challenges and dynamics to the family. It is not unusual to have feelings of excitement along with confusing shifts in moods, emotions, and thoughts. All mothers are at risk of developing postpartum depression or the "baby blues." These mood changes can occur right after giving birth, or they may occur many months after giving birth. The baby blues or postpartum depression can be mild or severe. Additionally, postpartum depression can go away rather quickly, or it can be a long-term condition.  CAUSES Raised hormone levels and the rapid drop in those levels are thought to be a main cause of postpartum depression and the baby blues. A number of hormones change during and after pregnancy. Estrogen and progesterone usually decrease right after the delivery of your baby. The levels of thyroid hormone and various cortisol steroids also rapidly drop. Other factors that play a role in these mood changes include major life events and genetics.  RISK FACTORS If you have any of the following risks for the baby blues or postpartum depression, know what symptoms to watch out for during the postpartum period. Risk factors that may increase the likelihood of getting the baby blues or postpartum depression include:  Having a personal or family history of depression.   Having depression while being pregnant.   Having premenstrual mood issues or mood issues related to oral contraceptives.  Having a lot of life stress.   Having marital conflict.   Lacking a social support network.   Having a baby with special needs.   Having health problems, such as diabetes.   SIGNS AND SYMPTOMS Symptoms of baby blues include:  Brief changes in mood, such as going from extreme happiness to sadness.  Decreased concentration.   Difficulty sleeping.   Crying spells, tearfulness.   Irritability.   Anxiety.  Symptoms of postpartum depression typically begin within the first month after giving birth. These symptoms include:  Difficulty sleeping or excessive sleepiness.   Marked weight loss.   Agitation.   Feelings of worthlessness.   Lack of interest in activity or food.  Postpartum psychosis is a very serious condition and can be dangerous. Fortunately, it is rare. Displaying any of the following symptoms is cause for immediate medical attention. Symptoms of postpartum psychosis include:   Hallucinations and delusions.   Bizarre or disorganized behavior.   Confusion or disorientation.  DIAGNOSIS  A diagnosis is made by an evaluation of your symptoms. There are no medical or lab tests that lead to a diagnosis, but there are various questionnaires that a health care provider may use to identify those with the baby blues, postpartum depression, or psychosis. Often, a screening tool called the [place] Postnatal Depression Scale is used to diagnose depression in the postpartum period.  TREATMENT The baby blues usually goes away on its own in 1-2 weeks. Social support is often all that is needed. You will be encouraged to get adequate sleep and rest. Occasionally, you may  be given medicines to help you sleep.  Postpartum depression requires treatment because it can last several months or longer if it is not treated. Treatment may include individual or group therapy, medicine, or both to address any social, physiological, and psychological factors that may play a role in the depression. Regular exercise, a healthy diet, rest, and social support may also be strongly recommended.  Postpartum psychosis is more serious and needs treatment right away.  Hospitalization is often needed. HOME CARE INSTRUCTIONS  Get as much rest as you can. Nap when the baby sleeps.   Exercise regularly. Some women find yoga and walking to be beneficial.   Eat a balanced and nourishing diet.   Do little things that you enjoy. Have a cup of tea, take a bubble bath, read your favorite magazine, or listen to your favorite music.  Avoid alcohol.   Ask for help with household chores, cooking, grocery shopping, or running errands as needed. Do not try to do everything.   Talk to people close to you about how you are feeling. Get support from your partner, family members, friends, or other new moms.  Try to stay positive in how you think. Think about the things you are grateful for.   Do not spend a lot of time alone.   Only take over-the-counter or prescription medicine as directed by your health care provider.  Keep all your postpartum appointments.   Let your health care provider know if you have any concerns.  SEEK MEDICAL CARE IF: You are having a reaction to or problems with your medicine. SEEK IMMEDIATE MEDICAL CARE IF:  You have suicidal feelings.   You think you may harm the baby or someone else. MAKE SURE YOU:  Understand these instructions.  Will watch your condition.  Will get help right away if you are not doing well or get worse. Document Released: 10/28/2003 Document Revised: 01/28/2013 Document Reviewed: 11/04/2012 [facility] Patient Information 2015 [place], [place]. This information is not intended to replace advice given to you by your health care provider. Make sure you discuss any questions you have with your health care provider.

## 2013-09-14 NOTE — Discharge Summary (Signed)
  [name]    Subjective: Post Partum Day 2 Vaginal delivery, 2nd degree vaginal and bilateral periurethrals laceration Patient up ad lib, denies syncope or dizziness. Reports consuming regular diet without issues and denies N/V No issues with urination and reports bleeding is appropriate  Feeding:  breastfeeding Contraceptive plan:   Unsure Denies pain or the need for pain medication at home  Objective: Temp:  [98.5 F (36.9 C)-98.9 F (37.2 C)] 98.5 F (36.9 C) (08/09 1812) Pulse Rate:  [79-99] 99 (08/09 1812) Resp:  [18-20] 20 (08/09 1812) BP: (118-128)/(70-78) 128/78 mmHg (08/09 1812) SpO2:  [100 %] 100 % (08/09 0543)  Physical Exam:  General: alert and cooperative Ext: WNL, no edema. No evidence of DVT seen on physical exam. Breast: Soft filling Lungs: CTAB Heart RRR without murmur  Abdomen:  Soft, fundus firm, lochia scant, + bowel sounds, non distended, non tender Lochia: appropriate Uterine Fundus: firm Laceration: healing well    Recent Labs  09/13/13 0658  HGB 11.2*  HCT 33.1*    Assessment S/P Vaginal Delivery-Day 2 Stable  Normal Involution Breastfeeding Circumcision: declined  Plan: Continue current care Discharge home, Breastfeeding and Contraception unsure Lactation support   Margaree Sandhu, CNM, MSN 09/15/2013, 0900

## 2013-09-14 NOTE — Progress Notes (Signed)
Infant has fed twice in 11 hours with last feeding being at 0000. When infant does latch, sucking is intermittent and then infant falls asleep. Parents are resistant to any advice given regarding breastfeeding or any other infant care. Refuses assistance with latching infant or trying different breastfeeding positions. Infant is also sucking top lip and parents given information and demonstration on suck training which was not used. Mother stated "I do not want to hear him cry for any reason". Infant was seen by lactation last evening at which time latch was evaluated, but nursing has regressed since then with last good latch observed at 0000 feeding. Infant latching on tip of nipple with not enough depth.

## 2013-09-14 NOTE — Lactation Note (Signed)
This note was copied from the chart of [name] Blanchett. Lactation Consultation Note  Patient Name: [name] Gibbins ONGEX'BToday's Date: 09/14/2013 Reason for consult: Follow-up assessment of this primipara and her 541 hour old baby.  Baby has only had one void since birth and it was concentrated with uric acid crystals.  Baby has had 2 stools but will be 48 hours old tonight and LC discussed normal output requirements to indicate that baby is receiving enough food. LC reviewed feeding guidelines with ebm based on hour of life, output guidelines, suck training and latch attempts, with or without NS.  Parents are reluctant to feed formula.  Mom has just obtained about 7 ml's of ebm which they will feed the baby in 1-2 hours with bottle nipple and mom will continue pumping and feeding ebm until baby able to latch well and sustain latch for milk transfer. Plan reviewed with parents and discussed with RN, Steward DroneBrenda.     Maternal Data    Feeding Feeding Type: Breast Fed  LATCH Score/Interventions        most recent LATCH score=7 with LC, Vernona RiegerLaura today              Lactation Tools Discussed/Used Pump Review: Milk Storage Recommend pumping every 2-3 hours and continue suck training, latch attempts and feed ebm based on guidelines (handout given)  Consult Status Consult Status: Follow-up Date: 09/15/13 Follow-up type: In-patient    Warrick ParisianBryant, Marlicia Sroka [facility] 09/14/2013, 8:24 PM

## 2013-09-14 NOTE — Progress Notes (Addendum)
Subjective: Postpartum Day 1: Vaginal delivery, 2nd degree vaginal and bilateral periurethral lacerations Patient up ad lib, reports no syncope or dizziness. Feeding:  Breast--reports getting "different information" from staff.  Note in patient chart from RN states couple is "resistant to any advice regarding breastfeeding or any other infant care".   Contraceptive plan:  Undecided  Objective: Vital signs in last 24 hours: Temp:  [98.9 F (37.2 C)-99.7 F (37.6 C)] 98.9 F (37.2 C) (08/09 0543) Pulse Rate:  [79-80] 79 (08/09 0543) Resp:  [18] 18 (08/09 0543) BP: (118)/(70-74) 118/70 mmHg (08/09 0543) SpO2:  [100 %] 100 % (08/09 0543)  Physical Exam:  General: alert Lochia: appropriate Uterine Fundus: firm Perineum: healing well DVT Evaluation: No evidence of DVT seen on physical exam. Negative Homan's sign.    Recent Labs  09/13/13 0658  HGB 11.2*  HCT 33.1*  CBC done post-delivery on day 0--no need for repeat today.  Assessment/Plan: Status post vaginal delivery day 1. Stable Continue current care. Plan for discharge tomorrow Reviewed process of incorporating differing information from staff into the patient's own plan of care.  Encouraged patient and partner to communicate questions and any need for clarifications to staff when differing opinions are offered.      Nyra CapesLATHAM, VICKICNM 09/14/2013, 10:46 AM

## 2013-09-14 NOTE — Progress Notes (Signed)
Mother gave infant 3 ml of breast milk per bottle. Parents given information regarding plan of care for this evening such as obtaining weight and infant assessment. Father of infant had questions regarding why this was done every evening and do we do this with all infants. Parents reassured this is routine for this unit and father expressed concern over possible high bili check and redrawing of infants blood. If bilirubin is in the range for a serum bili to be done they would like to discuss it with pediatrician before it is drawn in the again.

## 2013-09-15 NOTE — Lactation Note (Addendum)
This note was copied from the chart of [name]. Lactation Consultation Note  Patient Name: [name] ZOXWR'UToday's Date: 09/15/2013 Reason for consult: Follow-up assessment Mom called for assist with latch as instructed. Demonstrated to Mom how to sandwich her nipple to help obtain more depth with latch, untucking lower lip. Baby demonstrated a good suckling pattern with few noted swallows with breast massage. Some nipple compression noted at the end of the feeding. Offered to retry nipple shield, Mom declined. At this time Mom will continue previous plan discussed. Reviewed output chart for baby's 1st week of like, page 24 Baby N Me Booklet. Advised parents baby should be at the breast 8-12 times in 24 hours for 15-20 minutes. Advised to wake baby as needed.   Maternal Data    Feeding Feeding Type: Breast Fed Length of feed: 13 min  LATCH Score/Interventions Latch: Grasps breast easily, tongue down, lips flanged, rhythmical sucking. Intervention(s): Adjust position;Assist with latch;Breast massage;Breast compression  Audible Swallowing: A few with stimulation  Type of Nipple: Everted at rest and after stimulation (aerola edema, short shaft)  Comfort (Breast/Nipple): Soft / non-tender  Problem noted: Mild/Moderate discomfort Interventions (Mild/moderate discomfort): Hand expression  Hold (Positioning): Assistance needed to correctly position infant at breast and maintain latch. Intervention(s): Breastfeeding basics reviewed;Support Pillows;Position options;Skin to skin  LATCH Score: 8  Lactation Tools Discussed/Used     Consult Status Consult Status: Complete Date: 09/15/13 Follow-up type: In-patient    [name], [name] 09/15/2013, 12:39 PM

## 2013-09-15 NOTE — Lactation Note (Signed)
This note was copied from the chart of Jackie Raechel ChuteKrystle Pursifull. Lactation Consultation Note  Patient Name: [name] ZOXWR'UToday's Date: 09/15/2013 Reason for consult: Follow-up assessment Mom had recently tried to BF but baby was sleepy. Mom pumped and received 9 ml of colostrum. Baby now giving feeding ques. Mom attempted to latch baby, some nipple tenderness with initial latch. Baby sucks his mouth with latch but with breast compression and untucking the bottom lip baby was able to obtain more depth. Mom denied discomfort. Baby took few suckles off/on then fell asleep. Mom gave baby some EBM as appetizer via bottle with slow flow nipple. Baby awake and LC assisted Mom to re-latch in football hold. Demonstrated to Mom how to massage breast to help baby have more nutritive suckling pattern and empty breast well. Baby nursing on right breast and left breast was leaking. Baby nursed for 13 minutes then became sleepy. Mom pulled baby off the breast. LC reviewed how to detach baby from the breast by breaking suction. Baby then took the remaining EBM via bottle for total of 9 ml. Plan discussed with Mom is to pre-pump to help with latch by softening areola and resolving aerola edema. BF for 15-20 minutes each breast when possible. Post for for 10-15 minutes and give baby back any amount of EBM she receives. Mom needs to continue this plan till voids/stools aedquate, weight loss stabilizes and they have Peds f/u on Wednesday. Mom has DEBP for home use. Engorgement care reviewed if needed. Advised of OP services and support group. Encouraged to call with next feeding for LC to observe latch.  Care for sore nipples reviewed with Mom, she has comfort gels.  Maternal Data    Feeding Feeding Type: Breast Fed Length of feed: 13 min  LATCH Score/Interventions Latch: Grasps breast easily, tongue down, lips flanged, rhythmical sucking. Intervention(s): Adjust position;Assist with latch;Breast massage;Breast  compression  Audible Swallowing: A few with stimulation  Type of Nipple: Everted at rest and after stimulation (aerola edema, short shaft)  Comfort (Breast/Nipple): Soft / non-tender  Problem noted: Mild/Moderate discomfort Interventions (Mild/moderate discomfort): Hand expression  Hold (Positioning): Assistance needed to correctly position infant at breast and maintain latch. Intervention(s): Breastfeeding basics reviewed;Support Pillows;Position options;Skin to skin  LATCH Score: 8  Lactation Tools Discussed/Used     Consult Status Consult Status: Complete Date: 09/15/13 Follow-up type: In-patient    [name], [name] 09/15/2013, 12:21 PM

## 2013-09-15 NOTE — Lactation Note (Signed)
This note was copied from the chart of Jackie Raechel ChuteKrystle Vanzee. Lactation Consultation Note Baby had 7% weight loss. Poor feeder 1st day of birth, started feeding well late afternoon. Mom has good colostrum. Baby feeds on one side the other breast is leaking colostrum. Foley cup given to catch colostrum that is leaking. Hand expression taught again to massage breast to relieve some of the pressure of breast leaking. Encouraged to massage breast during feeding so baby gets more colostrum. Encouraged deep latch in football hold d/t bouncy areolas. Very compressible. I have concern that when moms milk comes in the areolas and nipples will not be so compressible not allowing a deep latch and cause sore nipples and poor transfer of milk. Discussed this with mom. Encouraged chin tug. Heard audible swallows. Encouraged mom to relax during BF to allow colostrum to flow easily. Encouraged to wake baby if hasn't had feeding cues every 2-3 hrs.  Patient Name: [name] WUJWJ'XToday's Date: 09/15/2013 Reason for consult: Follow-up assessment   Maternal Data    Feeding Feeding Type: Breast Fed Length of feed: 20 min  LATCH Score/Interventions Latch: Grasps breast easily, tongue down, lips flanged, rhythmical sucking. Intervention(s): Adjust position;Assist with latch;Breast massage;Breast compression  Audible Swallowing: Spontaneous and intermittent Intervention(s): Skin to skin;Hand expression Intervention(s): Alternate breast massage  Type of Nipple: Everted at rest and after stimulation  Comfort (Breast/Nipple): Filling, red/small blisters or bruises, mild/mod discomfort  Problem noted: Filling;Mild/Moderate discomfort Interventions (Filling): Massage;Frequent nursing;Double electric pump Interventions (Mild/moderate discomfort): Comfort gels;Hand massage  Hold (Positioning): Assistance needed to correctly position infant at breast and maintain latch. Intervention(s): Breastfeeding basics  reviewed;Support Pillows;Position options;Skin to skin  LATCH Score: 8  Lactation Tools Discussed/Used Tools: Comfort gels;Pump   Consult Status Consult Status: Follow-up Date: 09/15/13 Follow-up type: In-patient    Mita Vallo, Diamond NickelLAURA G 09/15/2013, 2:57 AM

## 2013-12-08 ENCOUNTER — Encounter (HOSPITAL_COMMUNITY): Payer: Self-pay

## 2014-02-02 ENCOUNTER — Encounter: Payer: Self-pay | Admitting: *Deleted

## 2014-02-03 ENCOUNTER — Encounter: Payer: Self-pay | Admitting: Obstetrics & Gynecology

## 2015-04-12 ENCOUNTER — Other Ambulatory Visit (HOSPITAL_COMMUNITY): Payer: Self-pay | Admitting: Certified Nurse Midwife

## 2015-04-12 DIAGNOSIS — Z3A20 20 weeks gestation of pregnancy: Secondary | ICD-10-CM

## 2015-04-12 DIAGNOSIS — Z3689 Encounter for other specified antenatal screening: Secondary | ICD-10-CM

## 2015-04-13 ENCOUNTER — Ambulatory Visit (HOSPITAL_COMMUNITY)
Admission: RE | Admit: 2015-04-13 | Discharge: 2015-04-13 | Disposition: A | Discharge status: Home / Self Care | Payer: Managed Care, Other (non HMO) | Source: Ambulatory Visit | Attending: Certified Nurse Midwife | Admitting: Certified Nurse Midwife

## 2015-04-13 DIAGNOSIS — Z3A20 20 weeks gestation of pregnancy: Secondary | ICD-10-CM | POA: Diagnosis not present

## 2015-04-13 DIAGNOSIS — Z3689 Encounter for other specified antenatal screening: Secondary | ICD-10-CM

## 2015-04-13 DIAGNOSIS — Z36 Encounter for antenatal screening of mother: Secondary | ICD-10-CM | POA: Insufficient documentation

## 2017-10-03 IMAGING — US US MFM OB COMP +14 WKS
1 series · 14 of 28 positions shown · non-contrast
Comparison: none

[Series 1: us mfm ob comp +14 wks · 82 acquisitions, 14 frames shown]
[im 4/82]
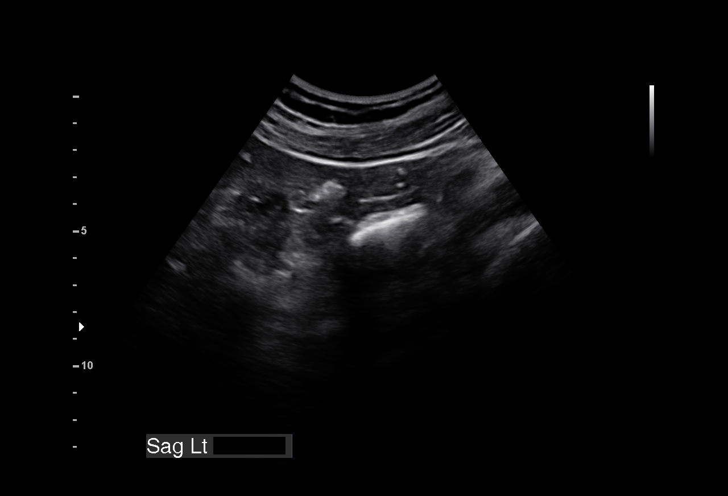
[im 10/82]
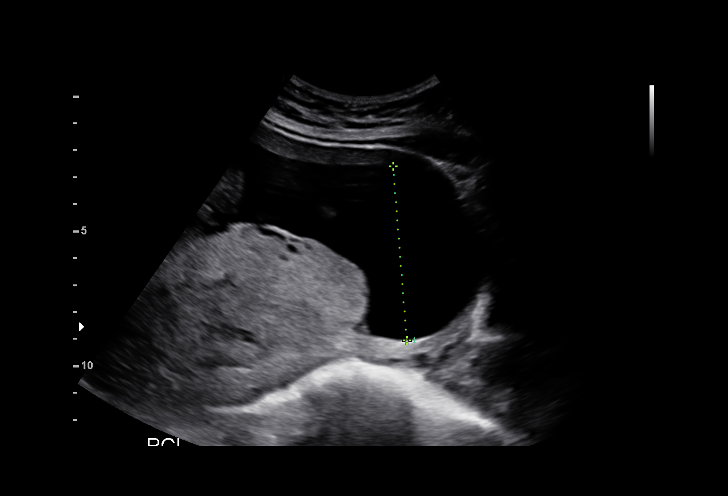
[im 16/82]
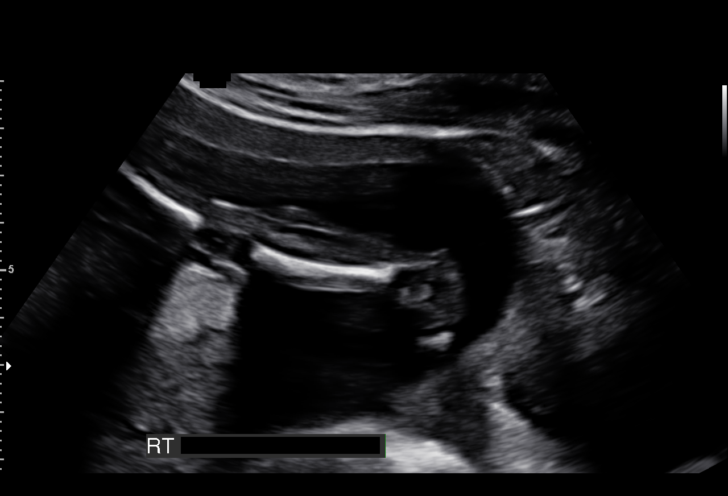
[im 22/82]
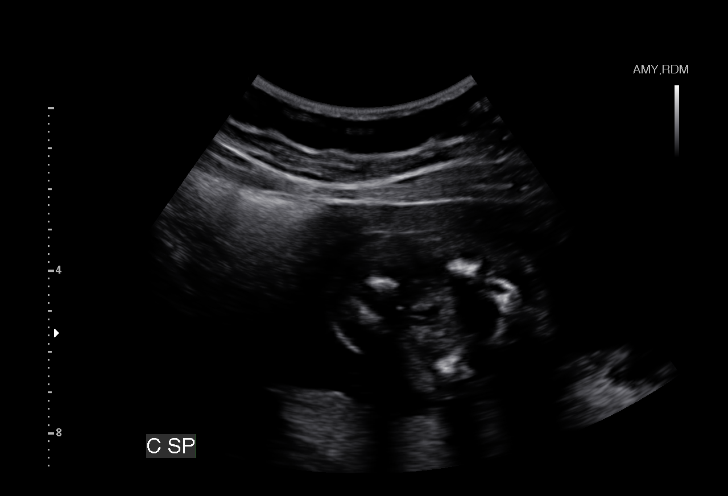
[im 28/82]
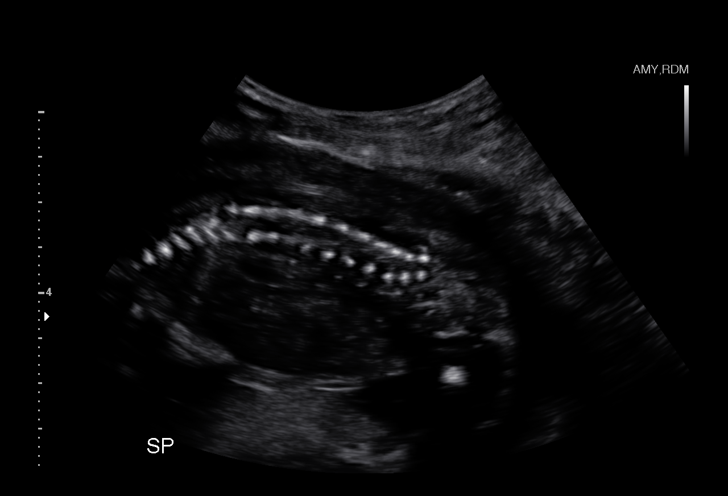
[im 34/82]
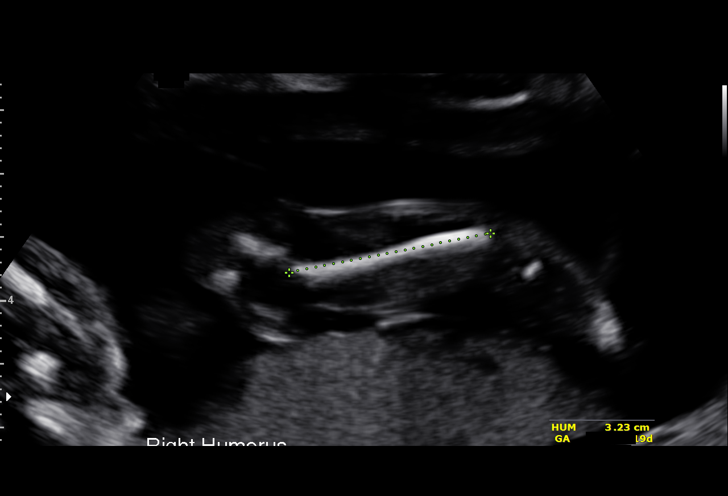
[im 40/82]
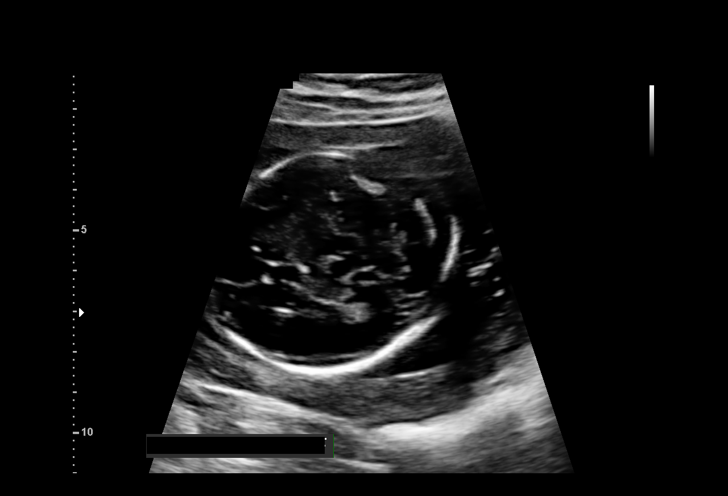
[im 46/82]
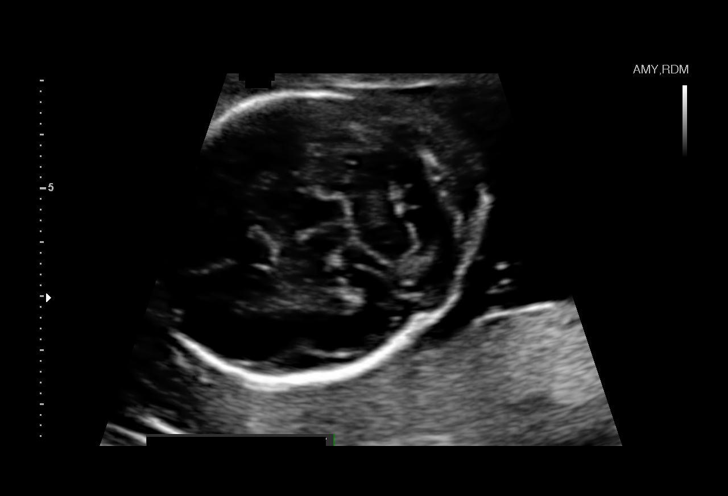
[im 52/82]
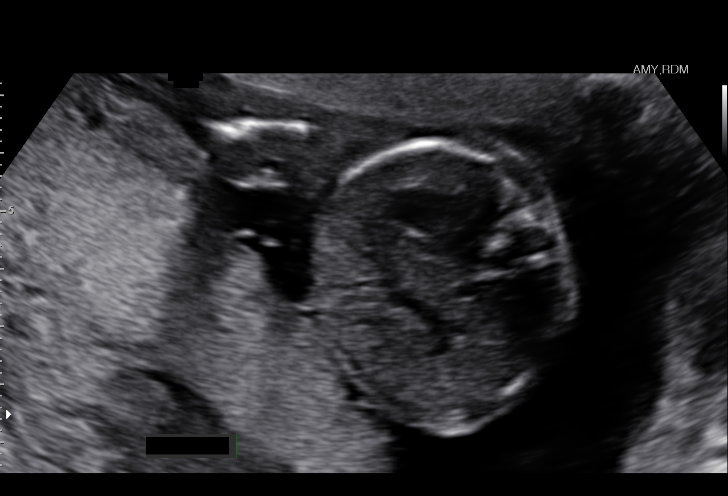
[im 58/82]
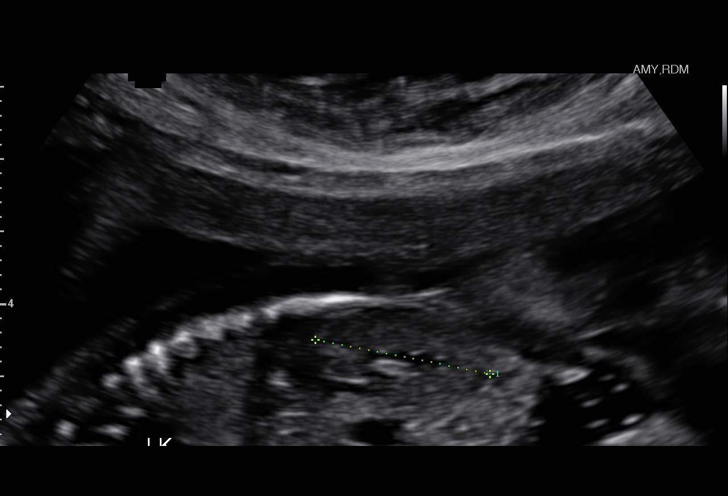
[im 64/82]
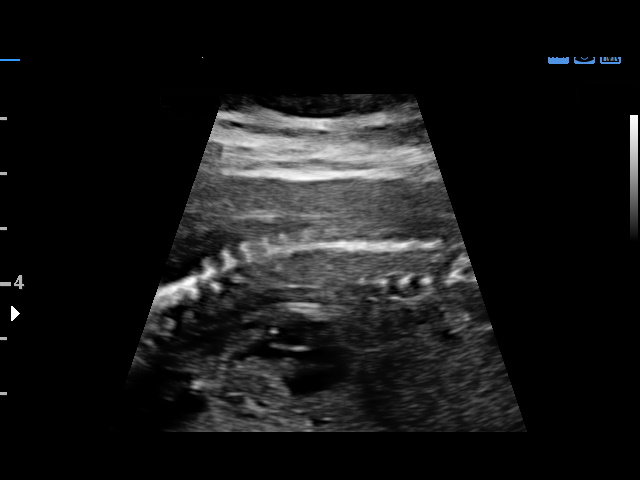
[im 70/82]
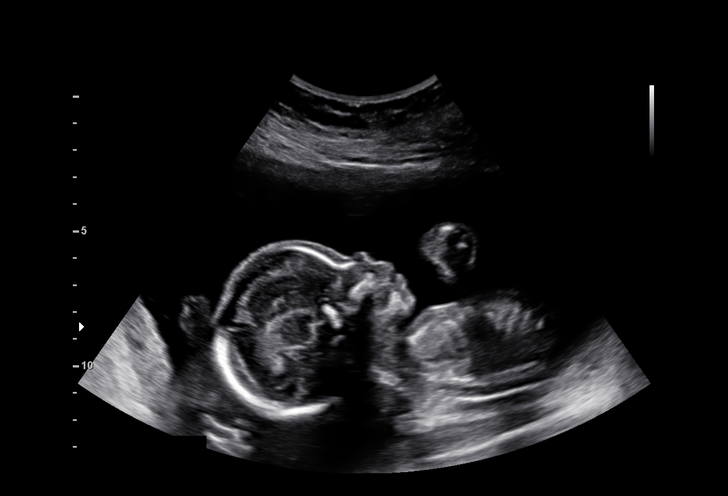
[im 76/82]
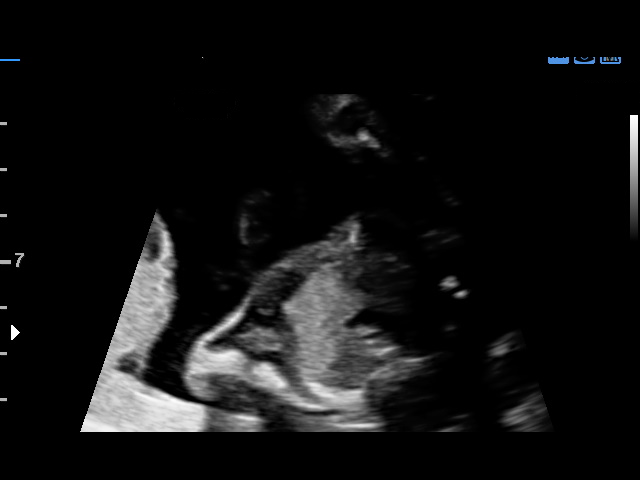
[im 82/82]
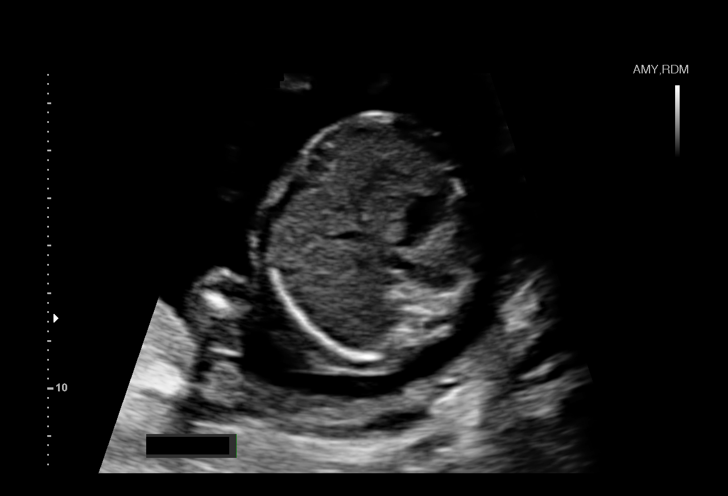

[14 of 28 positions shown; findings below may reference images not displayed]

am)

[address]
[place]

1  CLAIR BAUMANN         [id number]      [id number]     [id number]
Indications

Basic anatomic survey                          Z36
20 weeks gestation of pregnancy
OB History

Gravidity:    2         Term:   1        Prem:   0        SAB:   0
TOP:          0       Ectopic:  0        Living: 1
Fetal Evaluation

Num Of Fetuses:     1
Fetal Heart         138
Rate(bpm):
Cardiac Activity:   Observed
Presentation:       Variable
Placenta:           Posterior, above cervical os
P. Cord Insertion:  Visualized

Amniotic Fluid
AFI FV:      Subjectively within normal limits

Largest Pocket(cm)
6.5
Biometry

BPD:      51.5  mm     G. Age:  21w 5d                  CI:        72.23   %    70 - 86
FL/HC:      16.2   %    15.9 -
HC:      192.8  mm     G. Age:  21w 4d         80  %    HC/AC:      1.20        1.06 -
AC:      160.5  mm     G. Age:  21w 1d         63  %    FL/BPD:     60.8   %
FL:       31.3  mm     G. Age:  19w 5d         16  %    FL/AC:      19.5   %    20 - 24
HUM:      32.1  mm     G. Age:  20w 5d         54  %
CER:      22.6  mm     G. Age:  21w 2d         65  %
NFT:       5.2  mm

LV:        4.8  mm
CM:        6.2  mm

Est. FW:     367  gm    0 lb 13 oz      48  %
Gestational Age

LMP:           20w 4d        Date:  11/20/14                 EDD:   08/27/15
U/S Today:     21w 0d                                        EDD:   08/24/15
Best:          20w 4d     Det. By:  LMP  (11/20/14)          EDD:   08/27/15
Anatomy

Cranium:               Appears normal         Aortic Arch:            Appears normal
Cavum:                 Appears normal         Ductal Arch:            Not well visualized
Ventricles:            Appears normal         Diaphragm:              Not well visualized
Choroid Plexus:        Appears normal         Stomach:                Appears normal, left
sided
Cerebellum:            Appears normal         Abdomen:                Appears normal
Posterior Fossa:       Appears normal         Abdominal Wall:         Appears nml (cord
insert, abd wall)
Nuchal Fold:           Not applicable (>20    Cord Vessels:           Appears normal (3
wks GA)                                        vessel cord)
Face:                  Profile appears        Kidneys:                Appear normal
normal
Lips:                  Appears normal         Bladder:                Appears normal
Thoracic:              Appears normal         Spine:                  Appears normal
Heart:                 Not well visualized    Upper Extremities:      Appears normal
RVOT:                  Not well visualized    Lower Extremities:      Appears normal
LVOT:                  Appears normal

Other:  Fetus appears to be a male. Left 5th digit appears normal.
Cervix Uterus Adnexa

Cervix
Length:           3.84  cm.
Normal appearance by transabdominal scan. Appears closed, without
funnelling.

Left Ovary
Not visualized. No adnexal mass visualized.

Right Ovary
Not visualized. No adnexal mass visualized.
Impression

SIUP at 20+4 weeks
Normal detailed fetal anatomy; limited views of orbits, heart
and diaphragm
Markers of aneuploidy: none
Normal amniotic fluid volume
Measurements consistent with LMP dating
Recommendations

Follow-up ultrasound in 4-6 weeks to complete anatomy
survey
Attending Physician, EU
